# Patient Record
Sex: Female | Born: 1995 | Race: Black or African American | Hispanic: No | Marital: Single | Smoking: Never smoker
Health system: Southern US, Community
[De-identification: ages and names within clinical notes are randomized; demographics above are authoritative.]

## PROBLEM LIST (undated history)

## (undated) DIAGNOSIS — J45909 Unspecified asthma, uncomplicated: Secondary | ICD-10-CM

---
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
## 2017-04-24 ENCOUNTER — Other Ambulatory Visit: Payer: Self-pay

## 2017-04-24 ENCOUNTER — Emergency Department (HOSPITAL_COMMUNITY)
Admission: EM | Admit: 2017-04-24 | Discharge: 2017-04-24 | Disposition: A | Discharge status: Home / Self Care | Payer: BLUE CROSS/BLUE SHIELD | Attending: Emergency Medicine | Admitting: Emergency Medicine

## 2017-04-24 ENCOUNTER — Encounter (HOSPITAL_COMMUNITY): Payer: Self-pay | Admitting: Emergency Medicine

## 2017-04-24 DIAGNOSIS — J45909 Unspecified asthma, uncomplicated: Secondary | ICD-10-CM | POA: Diagnosis not present

## 2017-04-24 DIAGNOSIS — L0291 Cutaneous abscess, unspecified: Secondary | ICD-10-CM

## 2017-04-24 DIAGNOSIS — R222 Localized swelling, mass and lump, trunk: Secondary | ICD-10-CM | POA: Diagnosis present

## 2017-04-24 DIAGNOSIS — L0231 Cutaneous abscess of buttock: Secondary | ICD-10-CM | POA: Insufficient documentation

## 2017-04-24 MED ORDER — LIDOCAINE-EPINEPHRINE (PF) 2 %-1:200000 IJ SOLN
10.0000 mL | Freq: Once | INTRAMUSCULAR | Status: AC
  Administered 2017-04-24: 1 mL
  Filled 2017-04-24: qty 20

## 2017-04-24 MED ORDER — HYDROCODONE-ACETAMINOPHEN 5-325 MG PO TABS: 1.0000 | ORAL_TABLET | Freq: Four times a day (QID) | ORAL | 0 refills | Status: AC | PRN

## 2017-04-24 MED ORDER — SULFAMETHOXAZOLE-TRIMETHOPRIM 800-160 MG PO TABS
1.0000 | ORAL_TABLET | Freq: Once | ORAL | Status: AC
  Administered 2017-04-24: 1 via ORAL
  Filled 2017-04-24: qty 1

## 2017-04-24 MED ORDER — HYDROCODONE-ACETAMINOPHEN 5-325 MG PO TABS
1.0000 | ORAL_TABLET | Freq: Once | ORAL | Status: AC
  Administered 2017-04-24: 1 via ORAL
  Filled 2017-04-24: qty 1

## 2017-04-24 MED ORDER — SULFAMETHOXAZOLE-TRIMETHOPRIM 800-160 MG PO TABS: 1.0000 | ORAL_TABLET | Freq: Two times a day (BID) | ORAL | 0 refills | Status: AC

## 2017-04-24 NOTE — Discharge Instructions (Signed)
Please take Ibuprofen (Advil, motrin) and Tylenol (acetaminophen) to relieve your pain.  You may take up to 600 MG (3 pills) of normal strength ibuprofen every 8 hours as needed.  In between doses of ibuprofen you make take tylenol, up to 1,000 mg (two extra strength pills).  Do not take more than 3,000 mg tylenol in a 24 hour period.  Please check all medication labels as many medications such as pain and cold medications may contain tylenol.  Do not drink alcohol while taking these medications.  Do not take other NSAID'S while taking ibuprofen (such as aleve or naproxen).  Please take ibuprofen with food to decrease stomach upset. You may have diarrhea from the antibiotics.  It is very important that you continue to take the antibiotics even if you get diarrhea unless a medical professional tells you that you may stop taking them.  If you stop too early the bacteria you are being treated for will become stronger and you may need different, more powerful antibiotics that have more side effects and worsening diarrhea.  Please stay well hydrated and consider probiotics as they may decrease the severity of your diarrhea.  Please be aware that if you take any hormonal contraception (birth control pills, nexplanon, the ring, etc) that your birth control will not work while you are taking antibiotics and you need to use back up protection as directed on the birth control medication information insert.   Today you received medications that may make you sleepy or impair your ability to make decisions.  For the next 24 hours please do not drive, operate heavy machinery, care for a small child with out another adult present, or perform any activities that may cause harm to you or someone else if you were to fall asleep or be impaired.   You are being prescribed a medication which may make you sleepy. Please follow up of listed precautions for at least 24 hours after taking one dose.  

## 2017-04-24 NOTE — ED Notes (Signed)
Abscess/wound to right buttock, approximately dime size in diameter. Closed/no drainage.

## 2017-04-24 NOTE — ED Provider Notes (Signed)
[facility] Provider Note   CSN: [id number] Arrival date & time: 04/24/17  [id number]     History   Chief Complaint Chief Complaint  Patient presents with  . Abscess    HPI Erin Rowland is a 22 y.o. female who presents today for evaluation of boil on her right buttock.  She reports that this is been present for approximately 2 weeks.  She has been attempting warm soaks at home without significant relief.  Her pain is constant, made worse with pressure, walking.  No alleviating factors noted.  She denies any fevers or chills, no nausea/vomiting/diarrhea.  She denies any possibility of pregnancy, even after it was explained to her the potential risks of taking Bactrim while pregnant.  She reports that her tetanus is up-to-date.   HPI  Past Medical History:  Diagnosis Date  . Asthma     There are no active problems to display for this patient.   History reviewed. No pertinent surgical history.  OB History    No data available       Home Medications    Prior to Admission medications   Medication Sig Start Date End Date Taking? Authorizing Provider  HYDROcodone-acetaminophen (NORCO/VICODIN) 5-325 MG tablet Take 1 tablet by mouth every 6 (six) hours as needed for severe pain. 04/24/17   Cristina GongHammond, Lark Langenfeld W, PA-C  Multiple Vitamin (MULTIVITAMIN WITH MINERALS) TABS tablet Take 1 tablet by mouth daily.    [provider]  sulfamethoxazole-trimethoprim (BACTRIM DS,SEPTRA DS) 800-160 MG tablet Take 1 tablet by mouth 2 (two) times daily. 04/24/17   Cristina GongHammond, Danijah Noh W, PA-C    Family History No family history on file.  Social History Social History   Tobacco Use  . Smoking status: Never Smoker  . Smokeless tobacco: Never Used  Substance Use Topics  . Alcohol use: No  . Drug use: Yes    Types: Marijuana     Allergies   Eggs or egg-derived products   Review of Systems Review of Systems  Constitutional: Negative for chills  and fever.  Gastrointestinal: Negative for diarrhea, nausea and vomiting.  Skin: Positive for wound. Negative for color change and pallor.     Physical Exam Updated Vital Signs BP (!) 129/91 (BP Location: Right Arm)   Pulse 86   Temp 99.4 F (37.4 C) (Oral)   Resp 18   Ht 5\' 9"  (1.753 m)   Wt 97.5 kg (215 lb)   SpO2 100%   BMI 31.75 kg/m   Physical Exam  Constitutional: She is oriented to person, place, and time. She appears well-developed and well-nourished.  HENT:  Head: Normocephalic and atraumatic.  Cardiovascular: Intact distal pulses.  Neurological: She is alert and oriented to person, place, and time.  Skin: Skin is warm and dry. She is not diaphoretic.  There is a 2 cm x 2 cm area of swelling with what appears to be pus under the skin on the medial aspect of the right buttock.  This is clearly isolated from GI/GU structures..  There is no drainage from the area.  There is mild surrounding induration and erythema.  This area is tender to palpation.    Psychiatric: She has a normal mood and affect. Her behavior is normal.  Nursing note and vitals reviewed.    ED Treatments / Results  Labs (all labs ordered are listed, but only abnormal results are displayed) Labs Reviewed - No data to display  EKG  EKG Interpretation None  Radiology No results found.  Procedures .Marland KitchenIncision and Drainage Date/Time: 04/24/2017 10:00 PM Performed by: Cristina Gong, PA-C Authorized by: Cristina Gong, PA-C   Consent:    Consent obtained:  Verbal   Consent given by:  Patient   Risks discussed:  Bleeding, damage to other organs, incomplete drainage, infection and pain   Alternatives discussed:  No treatment, alternative treatment and referral Location:    Type:  Abscess   Size:  2cmx2cm   Location: Right buttock. Pre-procedure details:    Skin preparation:  Chloraprep Anesthesia (see MAR for exact dosages):    Anesthesia method:  Local infiltration    Local anesthetic:  Lidocaine 2% WITH epi Procedure type:    Complexity:  Simple Procedure details:    Incision types:  Stab incision   Incision depth:  Subcutaneous   Scalpel blade:  11   Wound management:  Probed and deloculated   Drainage:  Serosanguinous and purulent   Drainage amount:  Scant   Wound treatment:  Wound left open   Packing materials:  None Post-procedure details:    Patient tolerance of procedure:  Tolerated well, no immediate complications   (including critical care time)  Medications Ordered in ED Medications  sulfamethoxazole-trimethoprim (BACTRIM DS,SEPTRA DS) 800-160 MG per tablet 1 tablet (not administered)  HYDROcodone-acetaminophen (NORCO/VICODIN) 5-325 MG per tablet 1 tablet (not administered)  lidocaine-EPINEPHrine (XYLOCAINE W/EPI) 2 %-1:200000 (PF) injection 10 mL (1 mL Infiltration Given 04/24/17 2037)     Initial Impression / Assessment and Plan / ED Course  I have reviewed the triage vital signs and the nursing notes.  Pertinent labs & imaging results that were available during my care of the patient were reviewed by me and considered in my medical decision making (see chart for details).    Patient with skin abscess amenable to incision and drainage.  Abscess was not large enough to warrant packing or drain,  wound recheck in 2 days if not improving or has concerns. Encouraged home warm soaks and flushing.  Mild signs of cellulitis is surrounding skin.  Will d/c to home.  She was given instructions on over-the-counter pain management, along with a short course of Norco for breakthrough pain.  She is given a short course of Bactrim for antibiotic treatment.  Patient declined pregnancy testing prior to Rx for Bactrim, was informed of the potential risks to a developing fetus, and stated her understanding.  Final Clinical Impressions(s) / ED Diagnoses   Final diagnoses:  Abscess  Abscess of buttock, right    ED Discharge Orders        Ordered     HYDROcodone-acetaminophen (NORCO/VICODIN) 5-325 MG tablet  Every 6 hours PRN     04/24/17 2154    sulfamethoxazole-trimethoprim (BACTRIM DS,SEPTRA DS) 800-160 MG tablet  2 times daily     04/24/17 2154       Cristina Gong, PA-C 04/24/17 2202    Cristina Gong, PA-C 04/24/17 2204    Maia Plan, MD 04/25/17 [phone]

## 2017-04-24 NOTE — ED Triage Notes (Signed)
Pt c/o abscess to right buttocks x 2 weeks.
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
## 2021-02-05 ENCOUNTER — Other Ambulatory Visit: Payer: Self-pay

## 2021-02-05 ENCOUNTER — Encounter (HOSPITAL_COMMUNITY): Payer: Self-pay

## 2021-02-05 ENCOUNTER — Ambulatory Visit (HOSPITAL_COMMUNITY)
Admission: EM | Admit: 2021-02-05 | Discharge: 2021-02-05 | Disposition: A | Discharge status: Home / Self Care | Payer: BC Managed Care – PPO | Attending: Student | Admitting: Student

## 2021-02-05 DIAGNOSIS — J4520 Mild intermittent asthma, uncomplicated: Secondary | ICD-10-CM | POA: Diagnosis not present

## 2021-02-05 DIAGNOSIS — R509 Fever, unspecified: Secondary | ICD-10-CM

## 2021-02-05 DIAGNOSIS — J09X2 Influenza due to identified novel influenza A virus with other respiratory manifestations: Secondary | ICD-10-CM | POA: Diagnosis not present

## 2021-02-05 LAB — POC INFLUENZA A AND B ANTIGEN (URGENT CARE ONLY)
INFLUENZA A ANTIGEN, POC: POSITIVE — AB
INFLUENZA B ANTIGEN, POC: NEGATIVE

## 2021-02-05 MED ORDER — ACETAMINOPHEN 325 MG PO TABS
650.0000 mg | ORAL_TABLET | Freq: Once | ORAL | Status: AC
  Administered 2021-02-05: 650 mg via ORAL

## 2021-02-05 MED ORDER — PROMETHAZINE-DM 6.25-15 MG/5ML PO SYRP: 5.0000 mL | ORAL_SOLUTION | Freq: Four times a day (QID) | ORAL | 0 refills | Status: AC | PRN

## 2021-02-05 MED ORDER — ONDANSETRON 8 MG PO TBDP: 8.0000 mg | ORAL_TABLET | Freq: Three times a day (TID) | ORAL | 0 refills | Status: AC | PRN

## 2021-02-05 MED ORDER — OSELTAMIVIR PHOSPHATE 75 MG PO CAPS: 75.0000 mg | ORAL_CAPSULE | Freq: Two times a day (BID) | ORAL | 0 refills | Status: AC

## 2021-02-05 MED ORDER — ACETAMINOPHEN 325 MG PO TABS
ORAL_TABLET | ORAL | Status: AC
  Filled 2021-02-05: qty 2

## 2021-02-05 NOTE — ED Triage Notes (Signed)
Pt presents with c/o fever, chills, night sweats, headache x 3 days.

## 2021-02-05 NOTE — ED Provider Notes (Signed)
[facility]    CSN: [id number] Arrival date & time: 02/05/21  1644      History   Chief Complaint Chief Complaint  Patient presents with   Fever   Cough    HPI Villa HerbRyan Daily is a 25 y.o. female presenting with fevers/chills, bodyaches, headaches, decreased appetite, sore throat, nasal congestion, fatigue. Works as Engineer, siteschool teacher. History asthma, currently well controlled on albuterol prn, has been using this during illness. Last tylenol 6 hours ago. Decreased appetite but tolerating fluids and foods. Denies SOB, CP.  HPI  Past Medical History:  Diagnosis Date   Asthma     There are no problems to display for this patient.   History reviewed. No pertinent surgical history.  OB History   No obstetric history on file.      Home Medications    Prior to Admission medications   Medication Sig Start Date End Date Taking? Authorizing Provider  ondansetron (ZOFRAN ODT) 8 MG disintegrating tablet Take 1 tablet (8 mg total) by mouth every 8 (eight) hours as needed for nausea or vomiting. 02/05/21  Yes Rhys MartiniGraham, Jlen Wintle E, PA-C  oseltamivir (TAMIFLU) 75 MG capsule Take 1 capsule (75 mg total) by mouth every 12 (twelve) hours. 02/05/21  Yes Rhys MartiniGraham, Merlinda Wrubel E, PA-C  promethazine-dextromethorphan (PROMETHAZINE-DM) 6.25-15 MG/5ML syrup Take 5 mLs by mouth 4 (four) times daily as needed for cough. 02/05/21  Yes Rhys MartiniGraham, Ellery Meroney E, PA-C  HYDROcodone-acetaminophen (NORCO/VICODIN) 5-325 MG tablet Take 1 tablet by mouth every 6 (six) hours as needed for severe pain. 04/24/17   Cristina GongHammond, Elizabeth W, PA-C  Multiple Vitamin (MULTIVITAMIN WITH MINERALS) TABS tablet Take 1 tablet by mouth daily.    [provider]  sulfamethoxazole-trimethoprim (BACTRIM DS,SEPTRA DS) 800-160 MG tablet Take 1 tablet by mouth 2 (two) times daily. 04/24/17   Cristina GongHammond, Elizabeth W, PA-C    Family History History reviewed. No pertinent family history.  Social History Social History   Tobacco Use   Smoking  status: Never   Smokeless tobacco: Never  Substance Use Topics   Alcohol use: No   Drug use: Yes    Types: Marijuana     Allergies   Eggs or egg-derived products   Review of Systems Review of Systems  Constitutional:  Positive for appetite change, chills and fever.  HENT:  Positive for congestion. Negative for ear pain, rhinorrhea, sinus pressure, sinus pain and sore throat.   Eyes:  Negative for redness and visual disturbance.  Respiratory:  Positive for cough. Negative for chest tightness, shortness of breath and wheezing.   Cardiovascular:  Negative for chest pain and palpitations.  Gastrointestinal:  Negative for abdominal pain, constipation, diarrhea, nausea and vomiting.  Genitourinary:  Negative for dysuria, frequency and urgency.  Musculoskeletal:  Negative for myalgias.  Neurological:  Negative for dizziness, weakness and headaches.  Psychiatric/Behavioral:  Negative for confusion.   All other systems reviewed and are negative.   Physical Exam Triage Vital Signs ED Triage Vitals  Enc Vitals Group     BP 02/05/21 1747 117/71     Pulse Rate 02/05/21 1747 (!) 119     Resp 02/05/21 1747 18     Temp 02/05/21 1747 (!) 103.1 F (39.5 C)     Temp Source 02/05/21 1747 Oral     SpO2 02/05/21 1747 99 %     Weight --      Height --      Head Circumference --      Peak Flow --  Pain Score 02/05/21 1745 5     Pain Loc --      Pain Edu? --      Excl. in [place]? --    No data found.  Updated Vital Signs BP 117/71 (BP Location: Right Arm)   Pulse (!) 119   Temp (!) 103.1 F (39.5 C) (Oral)   Resp 18   LMP 12/31/2020 (Exact Date)   SpO2 99%   Visual Acuity Right Eye Distance:   Left Eye Distance:   Bilateral Distance:    Right Eye Near:   Left Eye Near:    Bilateral Near:     Physical Exam Vitals reviewed.  Constitutional:      General: She is not in acute distress.    Appearance: Normal appearance. She is not ill-appearing.  HENT:     Head:  Normocephalic and atraumatic.     Right Ear: Tympanic membrane, ear canal and external ear normal. No tenderness. No middle ear effusion. There is no impacted cerumen. Tympanic membrane is not perforated, erythematous, retracted or bulging.     Left Ear: Tympanic membrane, ear canal and external ear normal. No tenderness.  No middle ear effusion. There is no impacted cerumen. Tympanic membrane is not perforated, erythematous, retracted or bulging.     Nose: Nose normal. No congestion.     Mouth/Throat:     Mouth: Mucous membranes are moist.     Pharynx: Uvula midline. Posterior oropharyngeal erythema present. No oropharyngeal exudate.     Comments: Smooth erythema posterior pharynx  On exam, uvula is midline, she is tolerating her secretions without difficulty, there is no trismus, no drooling, she has normal phonation  Eyes:     Extraocular Movements: Extraocular movements intact.     Pupils: Pupils are equal, round, and reactive to light.  Cardiovascular:     Rate and Rhythm: Regular rhythm. Tachycardia present.     Heart sounds: Normal heart sounds.  Pulmonary:     Effort: Pulmonary effort is normal.     Breath sounds: Normal breath sounds. No decreased breath sounds, wheezing, rhonchi or rales.  Abdominal:     Palpations: Abdomen is soft.     Tenderness: There is no abdominal tenderness. There is no guarding or rebound.  Lymphadenopathy:     Cervical: No cervical adenopathy.     Right cervical: No superficial cervical adenopathy.    Left cervical: No superficial cervical adenopathy.  Neurological:     General: No focal deficit present.     Mental Status: She is alert and oriented to person, place, and time.  Psychiatric:        Mood and Affect: Mood normal.        Behavior: Behavior normal.        Thought Content: Thought content normal.        Judgment: Judgment normal.     UC Treatments / Results  Labs (all labs ordered are listed, but only abnormal results are  displayed) Labs Reviewed  POC INFLUENZA A AND B ANTIGEN (URGENT CARE ONLY) - Abnormal; Notable for the following components:      Result Value   INFLUENZA A ANTIGEN, POC POSITIVE (*)    All other components within normal limits    EKG   Radiology No results found.  Procedures Procedures (including critical care time)  Medications Ordered in UC Medications  acetaminophen (TYLENOL) tablet 650 mg (650 mg Oral Given 02/05/21 1750)    Initial Impression / Assessment and Plan / UC Course  I have reviewed the triage vital signs and the nursing notes.  Pertinent labs & imaging results that were available during my care of the patient were reviewed by me and considered in my medical decision making (see chart for details).     This patient is a very pleasant 25 y.o. year old female presenting with influenza A Last tylenol 6 hours ago. Febrile at 103.1, tachycardic.  Acetaminophen administered during visit. Influenza A positive States she is not pregnant or breastfeeding.  Tamiflu, zofran, promethazine DM, albuterol (she has this at home already).  ED return precautions discussed. Patient verbalizes understanding and agreement.   Coding Level 4 for acute illness with systemic symptoms, and prescription drug management   Final Clinical Impressions(s) / UC Diagnoses   Final diagnoses:  Influenza due to identified novel influenza A virus with other respiratory manifestations  Febrile illness  Mild intermittent asthma without complication     Discharge Instructions      -Tamiflu twice daily x5 days. This medication can cause nausea, so I also sent nausea medication. You can stop the Tamiflu if you don't like it or if it causes side effects.  -Take the Zofran (ondansetron) up to 3 times daily for nausea and vomiting. -Promethazine DM cough syrup for congestion/cough. This could make you drowsy, so take at night before bed. -Albuterol inhaler as needed for cough, wheezing,  shortness of breath, 1 to 2 puffs every 6 hours as needed. -For fevers/chills, bodyaches, headaches- You can take Tylenol up to 1000 mg 3 times daily, and ibuprofen up to 600 mg 3 times daily with food.  You can take these together, or alternate every 3-4 hours. -Drink plenty of water/gatorade and get plenty of rest -With a virus, you're typically contagious for 5-7 days, or as long as you're having fevers.  -Come back and see [place] if things are getting worse instead of better, like shortness of breath, chest pain, fevers and chills that are getting higher instead of lower and do not come down with Tylenol or ibuprofen, etc.      ED Prescriptions     Medication Sig Dispense Auth. Provider   promethazine-dextromethorphan (PROMETHAZINE-DM) 6.25-15 MG/5ML syrup Take 5 mLs by mouth 4 (four) times daily as needed for cough. 118 mL Rhys Martini, PA-C   oseltamivir (TAMIFLU) 75 MG capsule Take 1 capsule (75 mg total) by mouth every 12 (twelve) hours. 10 capsule Ignacia Bayley E, PA-C   ondansetron (ZOFRAN ODT) 8 MG disintegrating tablet Take 1 tablet (8 mg total) by mouth every 8 (eight) hours as needed for nausea or vomiting. 20 tablet Rhys Martini, PA-C      PDMP not reviewed this encounter.   Rhys Martini, PA-C 02/05/21 1843

## 2021-02-05 NOTE — Discharge Instructions (Addendum)
-  Tamiflu twice daily x5 days. This medication can cause nausea, so I also sent nausea medication. You can stop the Tamiflu if you don't like it or if it causes side effects.  -Take the Zofran (ondansetron) up to 3 times daily for nausea and vomiting. -Promethazine DM cough syrup for congestion/cough. This could make you drowsy, so take at night before bed. -Albuterol inhaler as needed for cough, wheezing, shortness of breath, 1 to 2 puffs every 6 hours as needed. -For fevers/chills, bodyaches, headaches- You can take Tylenol up to 1000 mg 3 times daily, and ibuprofen up to 600 mg 3 times daily with food.  You can take these together, or alternate every 3-4 hours. -Drink plenty of water/gatorade and get plenty of rest -With a virus, you're typically contagious for 5-7 days, or as long as you're having fevers.  -Come back and see us if things are getting worse instead of better, like shortness of breath, chest pain, fevers and chills that are getting higher instead of lower and do not come down with Tylenol or ibuprofen, etc.  

## 2021-02-05 NOTE — ED Triage Notes (Signed)
Last taken Tylenol at 1230.
